# Patient Record
Sex: Male | Born: 1965 | Race: White | Hispanic: No | Marital: Single
Health system: Southern US, Community
[De-identification: ages and names within clinical notes are randomized; demographics above are authoritative.]

---

## 1999-10-26 ENCOUNTER — Inpatient Hospital Stay (HOSPITAL_COMMUNITY): Admission: EM | Admit: 1999-10-26 | Discharge: 1999-10-29 | Payer: Self-pay | Admitting: Emergency Medicine

## 1999-10-26 ENCOUNTER — Encounter: Payer: Self-pay | Admitting: Orthopedic Surgery

## 1999-10-26 ENCOUNTER — Encounter: Payer: Self-pay | Admitting: Emergency Medicine

## 2000-01-12 ENCOUNTER — Encounter: Admission: RE | Admit: 2000-01-12 | Discharge: 2000-02-10 | Payer: Self-pay | Admitting: Orthopedic Surgery

## 2003-08-04 ENCOUNTER — Encounter: Payer: Self-pay | Admitting: Neurosurgery

## 2003-08-04 ENCOUNTER — Ambulatory Visit (HOSPITAL_COMMUNITY): Admission: RE | Admit: 2003-08-04 | Discharge: 2003-08-04 | Payer: Self-pay | Admitting: Neurosurgery

## 2007-03-25 ENCOUNTER — Ambulatory Visit (HOSPITAL_COMMUNITY): Admission: RE | Admit: 2007-03-25 | Discharge: 2007-03-25 | Payer: Self-pay | Admitting: Neurosurgery

## 2008-03-12 ENCOUNTER — Encounter: Admission: RE | Admit: 2008-03-12 | Discharge: 2008-03-12 | Payer: Self-pay | Admitting: Neurosurgery

## 2008-03-15 ENCOUNTER — Inpatient Hospital Stay (HOSPITAL_COMMUNITY): Admission: RE | Admit: 2008-03-15 | Discharge: 2008-03-19 | Payer: Self-pay | Admitting: Neurosurgery

## 2010-01-30 ENCOUNTER — Emergency Department (HOSPITAL_COMMUNITY): Admission: EM | Admit: 2010-01-30 | Discharge: 2010-01-30 | Payer: Self-pay | Admitting: Emergency Medicine

## 2011-03-10 NOTE — Op Note (Signed)
NAME:  Jonathan Glenn, Jonathan Glenn              ACCOUNT NO.:  [id number]   MEDICAL RECORD NO.:  [id number]          PATIENT TYPE:  INP   LOCATION:  3010                         FACILITY:  [facility]   PHYSICIAN:  Danae Orleans. Venetia Maxon, M.D.  DATE OF BIRTH:  09-07-1966   DATE OF PROCEDURE:  DATE OF DISCHARGE:                               OPERATIVE REPORT   PREOPERATIVE DIAGNOSIS:  Pseudoarthrosis L5-S1 with spondylosis,  degenerative disease, and back pain.   POSTOPERATIVE DIAGNOSIS:  Pseudoarthrosis L5-S1 with spondylosis,  degenerative disease, and back pain.   PROCEDURE:  1. Exploration of fusion L5-S1 levels.  2. Nonsegmental pedicle screw fixation L5-S1.  3. Posterolateral arthrodesis utilizing BMP Fortoss.   SURGEON:  Danae Orleans. Venetia Maxon, MD.   ASSISTANTS:  1. Georgiann Cocker, RN.  2. Hewitt Shorts, MD.   ANESTHESIA:  General endotracheal anesthesia.   ESTIMATED BLOOD LOSS:  200 mL.   COMPLICATIONS:  None.   DISPOSITION:  Recovery.   INDICATIONS:  Jonathan Glenn is a 45 year old man who 14 years ago had  undergone L5 Gill procedure with L5-S1 fusion with a stand-alone  threaded Ray cages.  Thirteen years later after lifting a heavy table,  he developed searing low back pain which was unrelenting and has been  persistent for greater than 1 year.  The workup of this demonstrated  that he had healed the interface between the cages at L5 with cystic  changes in the bone at the S1 level.  He also had a diskogram which  demonstrated the other disk levels were not problematic in the lumbar  spine, and it was elected to take him to surgery for exploration of his  fusion and supplementation with pedicle screw fixation.   PROCEDURE:  Jonathan Glenn was brought to the operating room.  Following  satisfactory uncomplicated induction of general endotracheal anesthesia  plus intravenous lines and a Foley catheter, the patient was placed in a  prone position on the [place] table.  Low back was shaved and  prepped  and draped in the usual sterile fashion.  The area of planned incision  was infiltrated with 0.25% Marcaine and 0.5% lidocaine with 1:200,000  epinephrine.  Incision was made in the midline, carried through  approximately 6 inches of adipose tissue to the lumbodorsal fascia which  was incised bilaterally.  Subperiosteal dissection was performed  exposing the previously operated level and the L5 transverse processes  in the sacral alae bilaterally.  A 120-mm retractor blades were placed  to facilitate exposure.  The posterolateral region was then decorticated  and prepared for later fusion.  The previous fusion was inspected.  There did not appear to be solid bony growth with evidence of motion at  this level.  The scar tissue was taken down along the previous fusion  mass to confirm this.  Subsequently, the pedicle screw fixation was  placed utilizing 6.5- x 45-mm screws at L5 and 6.5- x 45-mm sacral  screws at the sacrum.  Their positioning was confirmed on AP and lateral  fluoroscopy.  A 40-mm prelordosed rods were placed overlying the screw  heads after packing the  posterolateral region.  On each side, half a  small kit of BMP was utilized on the right side Fortoss mixed with the  patient's blood and then on the left side the BMP was overlaid with 10  mL of Actifuse.  The 10 mL of Fortoss on the opposite side.  Prior to  placing the bone graft and the BMP, the wound was extensively irrigated.  The lumbodorsal fascia was then closed with 1-0 Vicryl sutures,  subcutaneous tissues were reapproximated with 2-0 Vicryl interrupted  inverted sutures, and skin edges were approximated with interrupted 3-0  Vicryl subcuticular stitch.  The wound was dressed with Benzoin, Steri-  Strips, Telfa gauze, and tape.  The patient was exposed in the operating  room, taken to recovery in stable satisfactory condition having  tolerated this operation well.  Counts were correct at the end of the   case.      Danae Orleans. Venetia Maxon, M.D.  Electronically Signed     JDS/MEDQ  D:  03/15/2008  T:  03/16/2008  Job:  [id number]

## 2011-03-10 NOTE — Discharge Summary (Signed)
NAME:  Jonathan Glenn, Jonathan Glenn              ACCOUNT NO.:  [id number]   MEDICAL RECORD NO.:  [id number]          PATIENT TYPE:  INP   LOCATION:  3010                         FACILITY:  [facility]   PHYSICIAN:  Danae Orleans. Venetia Maxon, M.D.  DATE OF BIRTH:  Jun 30, 1966   DATE OF ADMISSION:  03/15/2008  DATE OF DISCHARGE:  03/19/2008                               DISCHARGE SUMMARY   REASON FOR ADMISSION:  Pseudoarthrosis of L5-S1 with spondylosis,  degenerative disk disease, and back pain.   ADMISSION DIAGNOSES:  1. Pseudoarthrosis of L5-S1 with spondylosis, degenerative disk      disease, and back pain.  2. _________with comorbidities of diabetes, morbid obesity, and      hypertension.   HISTORY OF PRESENT ILLNESS AND HOSPITAL COURSE:  Mr. Jonathan Glenn is a  45 year old man who 14 years ago underwent L5 Gill procedure L5-S1  fusion with Rake threaded cages.  He injured himself lifting a heavy  table about a year ago and developed severe and unrelenting back pain.  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  It was  elected to take him to surgery  for exploration of fusion and reduce fusion with pedicle screw fixation  at L5-S1 levels.  He did well with surgery postoperatively.  He slowly  mobilized.  He had some belly discomfort and abdominal distention which  gradually improved.  His blood sugars were well controlled on sliding  scale insulin.  He was doing well on Mar 19, 2008, with improved pain  management having moved his bowels, was up and mobilizing on back brace  and was discharged to home in stable and satisfactory condition having  tolerated his operation and hospitalization well.   DISCHARGE MEDICATIONS:  Include Percocet and Valium.   FINAL DIAGNOSES:  1. Pseudoarthrosis of L5-S1 with spondylosis, degenerative disk      disease, and back pain.  2. Along with comorbidities of diabetes, morbid obesity, and      hypertension.   DISCHARGE STATUS:  Improved.   FOLLOWUP:  Followup in the 3 weeks in the office.      Danae Orleans. Venetia Maxon, M.D.  Electronically Signed     JDS/MEDQ  D:  03/19/2008  T:  03/19/2008  Job:  [id number]

## 2011-03-13 NOTE — Op Note (Signed)
[place]  Patient:    OLUWAFEMI VILLELLA                      MRN: [id number] Proc. Date: 10/26/99 Adm. Date:  [id number] Attending:  Ollen Gross V                           Operative Report  PREOPERATIVE DIAGNOSIS:  Comminuted trimalleolar ankle fracture dislocation.  POSTOPERATIVE DIAGNOSIS:  Comminuted trimalleolar ankle fracture dislocation.  PROCEDURE:  Open reduction and internal fixation of left ankle fracture dislocation.  SURGEON:  Ollen Gross, M.D.  ASSISTANT:  Ralene Bathe, P.A.  ANESTHESIA:  General.  ESTIMATED BLOOD LOSS:  Minimal.  DRAINS:  None.  TOURNIQUET TIME:  41 minutes at 350.  COMPLICATIONS:  None.  CONDITION:  Stable to the recovery room.  INDICATIONS:  Nikai is a 45 year old male who was helping a friend move and slipped on a cardboard box sustaining a grossly comminuted fracture dislocation of the ankle.  He presents for immediate open reduction and internal fixation.  DESCRIPTION OF PROCEDURE:  After successful administration of general anesthesia, the posterior taler dislocation is reduced and then the tourniquet is placed high on the left thigh and the left upper extremity prepped and draped in the usual sterile fashion.  The extremity is wrapped in Esmarch and the tourniquet inflated to 350.  A long incision is made over the lateral malleolus approximately 8 cm n length as he had a very long oblique fracture of the fibula.  The fracture is split in the coronal plane and then with traction anatomic reduction is performed. Three lag screws are placed from anterior to posterior to effectively anatomically reduce the fibula.  A 10-hole 1/3 tibial plate is then placed and three holes are filled distally and to the bladder malleolus and then three are placed proximal into the shaft of the fibula just above the fracture line so as to effectively bridge across the fracture.  Under fluoroscopic  guidance it was then noted that the entire ankle mortis had anatomically reduced.  An incision is then made over the medial malleolus longitudinal and the fracture edges are subperiosteally elevated and hen anatomic reduction performed.  Two 2.9 drill bits were then drilled from the tip of the fibula up and across into the tibial shaft.  50 mm cancellous screws are placed parallel to each other and this effectively anatomically reduced the medial malleolar fragment and pulled in the posterior malleolar fragment also. Multiple views are taken and it showed anatomic reduction.  At this point both wounds are copiously irrigated with antibiotic solution and tourniquet is released with total time of 41 minutes.  Subcu is closed with interrupted 2-0 Vicryl, subcuticular ith interrupted 4-0 nylon.  Incision is clean and dried and a bulky sterile dressing and posterior splint applied.  The patient is then awakend and transported to the recovery room in stable condition. DD:  10/26/99 TD:  10/27/99 Job: [id number]

## 2011-07-22 LAB — CBC
HCT: 42.4
MCHC: 33.9
MCV: 87.3
Platelets: 261
RDW: 13.1

## 2011-07-22 LAB — BASIC METABOLIC PANEL
BUN: 12
CO2: 26
GFR calc non Af Amer: >60
Glucose, Bld: 132 — ABNORMAL HIGH
Potassium: 3.8

## 2011-07-22 LAB — TYPE AND SCREEN

## 2011-12-18 IMAGING — CR DG WRIST COMPLETE 3+V*L*
4 series · 4 of 4 positions shown · non-contrast
Comparison: None

CLINICAL DATA: Left wrist pain following injury and fall.

LEFT WRIST - COMPLETE 3+ VIEW

[x wrist pa left]
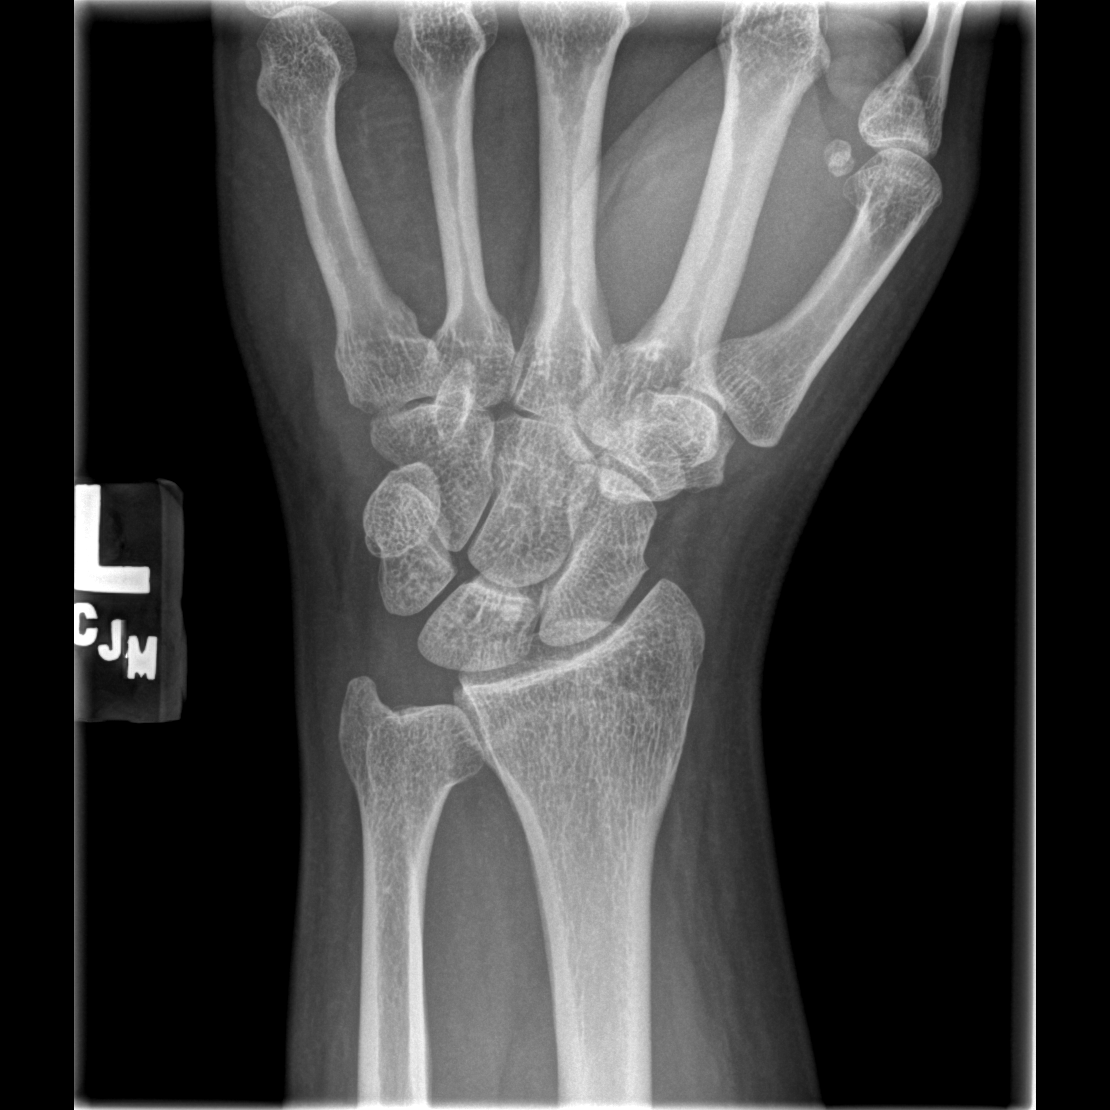

[x wrist obl left]
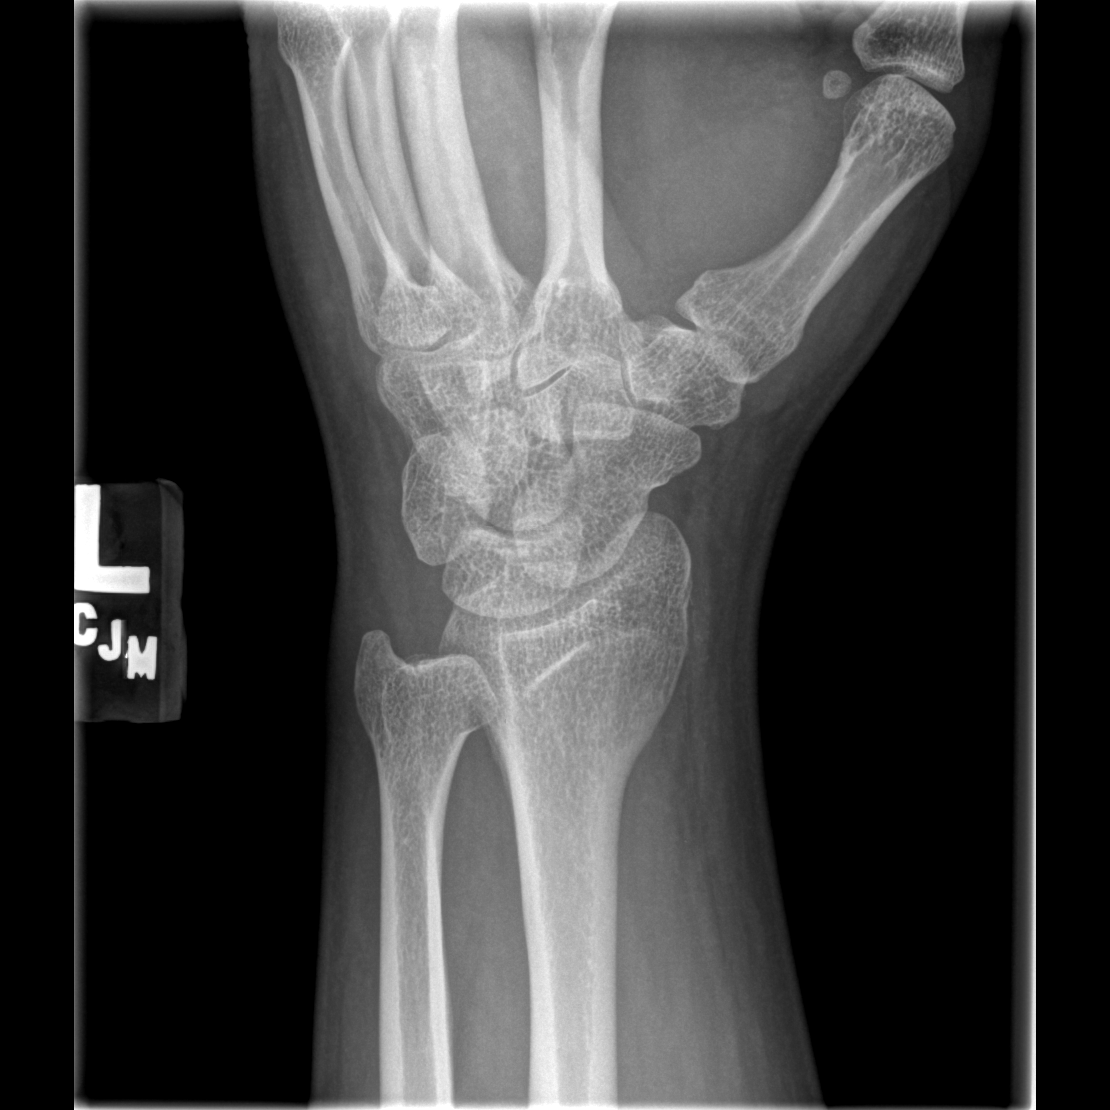

[x wrist lat left]
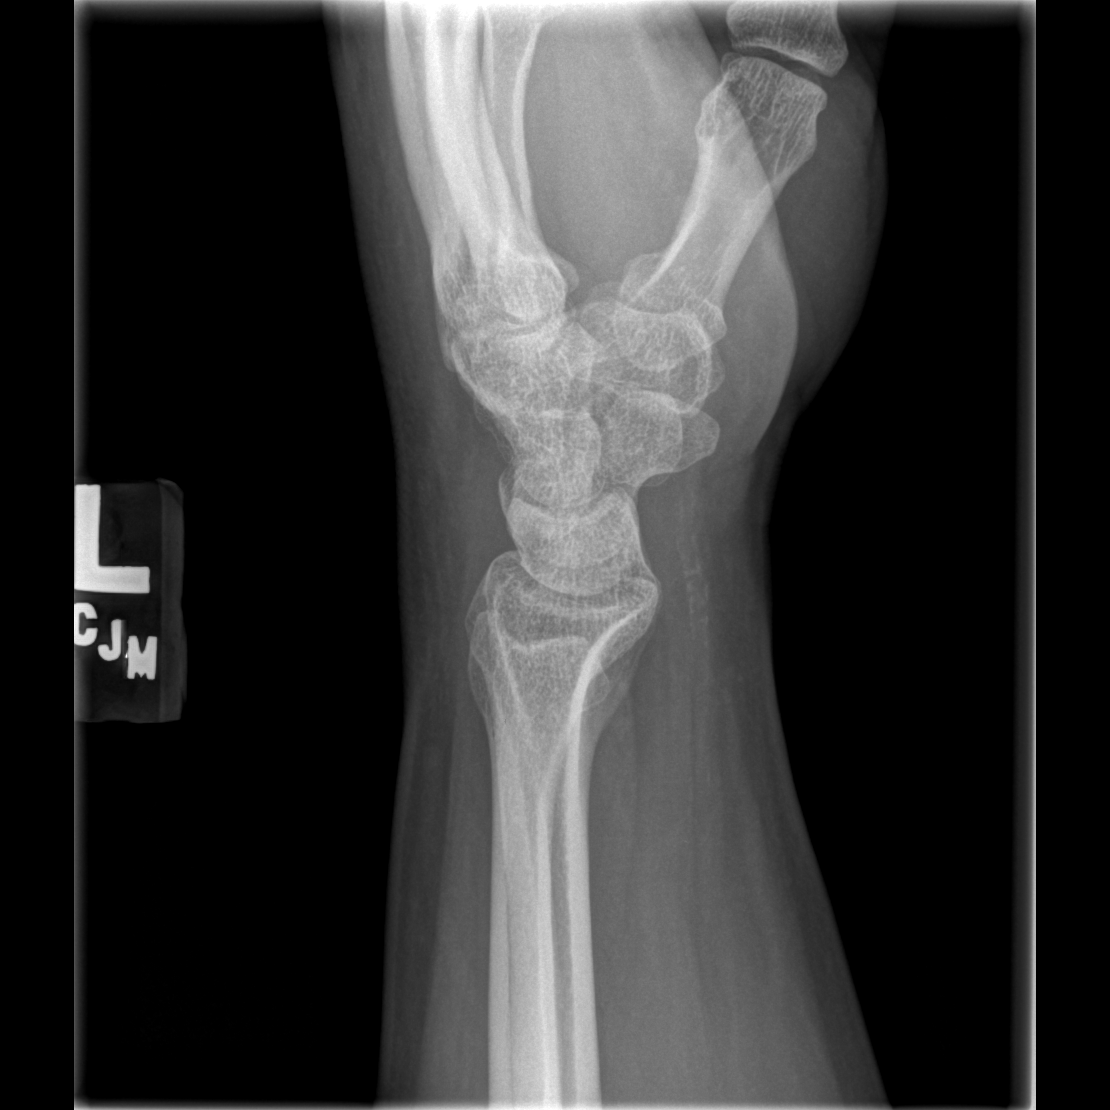

[x navicular]
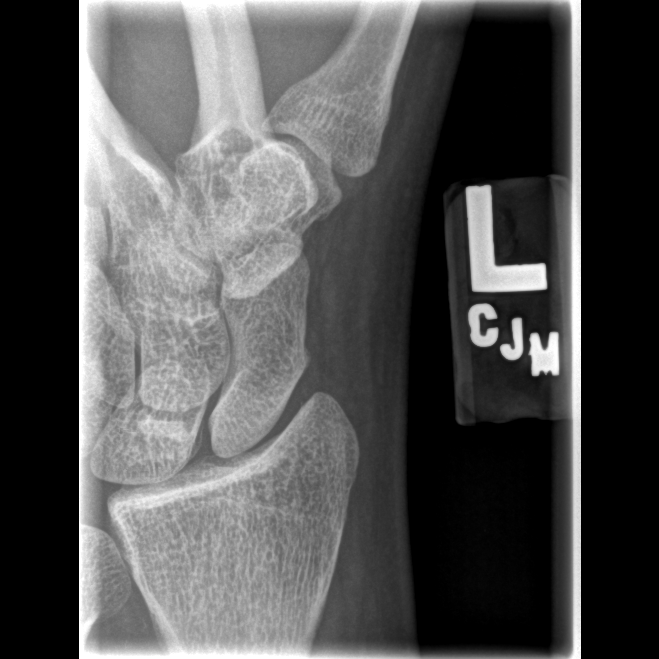

[4 of 4 positions shown; findings below may reference images not displayed]

FINDINGS: No evidence of acute fracture, subluxation or dislocation
identified.

No radio-opaque foreign bodies are present.

No focal bony lesions are noted.

The joint spaces are unremarkable.

Negative ulnar variance is noted.
Vascular calcifications are identified.
IMPRESSION: No evidence of acute bony abnormality.

## 2013-09-26 DIAGNOSIS — G609 Hereditary and idiopathic neuropathy, unspecified: Secondary | ICD-10-CM | POA: Insufficient documentation

## 2016-01-02 DIAGNOSIS — K645 Perianal venous thrombosis: Secondary | ICD-10-CM | POA: Diagnosis not present

## 2016-02-04 DIAGNOSIS — M545 Low back pain: Secondary | ICD-10-CM | POA: Diagnosis not present

## 2016-02-04 DIAGNOSIS — M5416 Radiculopathy, lumbar region: Secondary | ICD-10-CM | POA: Diagnosis not present

## 2016-02-10 DIAGNOSIS — Z5181 Encounter for therapeutic drug level monitoring: Secondary | ICD-10-CM | POA: Diagnosis not present

## 2016-02-10 DIAGNOSIS — E11319 Type 2 diabetes mellitus with unspecified diabetic retinopathy without macular edema: Secondary | ICD-10-CM | POA: Diagnosis not present

## 2016-02-10 DIAGNOSIS — Z794 Long term (current) use of insulin: Secondary | ICD-10-CM | POA: Diagnosis not present

## 2016-02-10 DIAGNOSIS — E1142 Type 2 diabetes mellitus with diabetic polyneuropathy: Secondary | ICD-10-CM | POA: Diagnosis not present

## 2016-05-04 DIAGNOSIS — M5416 Radiculopathy, lumbar region: Secondary | ICD-10-CM | POA: Diagnosis not present

## 2016-05-04 DIAGNOSIS — M545 Low back pain: Secondary | ICD-10-CM | POA: Diagnosis not present

## 2016-06-01 DIAGNOSIS — Z5181 Encounter for therapeutic drug level monitoring: Secondary | ICD-10-CM | POA: Diagnosis not present

## 2016-06-01 DIAGNOSIS — Z794 Long term (current) use of insulin: Secondary | ICD-10-CM | POA: Diagnosis not present

## 2016-06-01 DIAGNOSIS — Z7984 Long term (current) use of oral hypoglycemic drugs: Secondary | ICD-10-CM | POA: Diagnosis not present

## 2016-06-01 DIAGNOSIS — E1142 Type 2 diabetes mellitus with diabetic polyneuropathy: Secondary | ICD-10-CM | POA: Diagnosis not present

## 2016-06-03 DIAGNOSIS — E1142 Type 2 diabetes mellitus with diabetic polyneuropathy: Secondary | ICD-10-CM | POA: Diagnosis not present

## 2016-06-03 DIAGNOSIS — Z794 Long term (current) use of insulin: Secondary | ICD-10-CM | POA: Diagnosis not present

## 2016-06-03 DIAGNOSIS — E11319 Type 2 diabetes mellitus with unspecified diabetic retinopathy without macular edema: Secondary | ICD-10-CM | POA: Diagnosis not present

## 2016-07-29 DIAGNOSIS — Z6834 Body mass index (BMI) 34.0-34.9, adult: Secondary | ICD-10-CM | POA: Diagnosis not present

## 2016-07-29 DIAGNOSIS — M545 Low back pain: Secondary | ICD-10-CM | POA: Diagnosis not present

## 2016-07-29 DIAGNOSIS — M5416 Radiculopathy, lumbar region: Secondary | ICD-10-CM | POA: Diagnosis not present

## 2016-10-14 DIAGNOSIS — M5416 Radiculopathy, lumbar region: Secondary | ICD-10-CM | POA: Diagnosis not present

## 2016-10-14 DIAGNOSIS — M545 Low back pain: Secondary | ICD-10-CM | POA: Diagnosis not present

## 2016-12-09 DIAGNOSIS — E11319 Type 2 diabetes mellitus with unspecified diabetic retinopathy without macular edema: Secondary | ICD-10-CM | POA: Diagnosis not present

## 2016-12-09 DIAGNOSIS — Z794 Long term (current) use of insulin: Secondary | ICD-10-CM | POA: Diagnosis not present

## 2016-12-09 DIAGNOSIS — E1142 Type 2 diabetes mellitus with diabetic polyneuropathy: Secondary | ICD-10-CM | POA: Diagnosis not present

## 2017-01-05 DIAGNOSIS — I10 Essential (primary) hypertension: Secondary | ICD-10-CM | POA: Diagnosis not present

## 2017-01-05 DIAGNOSIS — M545 Low back pain: Secondary | ICD-10-CM | POA: Diagnosis not present

## 2017-01-05 DIAGNOSIS — Z6833 Body mass index (BMI) 33.0-33.9, adult: Secondary | ICD-10-CM | POA: Diagnosis not present

## 2017-01-05 DIAGNOSIS — M5416 Radiculopathy, lumbar region: Secondary | ICD-10-CM | POA: Diagnosis not present

## 2017-03-31 DIAGNOSIS — I10 Essential (primary) hypertension: Secondary | ICD-10-CM | POA: Diagnosis not present

## 2017-03-31 DIAGNOSIS — Z6834 Body mass index (BMI) 34.0-34.9, adult: Secondary | ICD-10-CM | POA: Diagnosis not present

## 2017-03-31 DIAGNOSIS — M545 Low back pain: Secondary | ICD-10-CM | POA: Diagnosis not present

## 2017-04-13 DIAGNOSIS — E11311 Type 2 diabetes mellitus with unspecified diabetic retinopathy with macular edema: Secondary | ICD-10-CM | POA: Diagnosis not present

## 2017-04-26 DIAGNOSIS — M545 Low back pain: Secondary | ICD-10-CM | POA: Diagnosis not present

## 2017-05-17 DIAGNOSIS — M5416 Radiculopathy, lumbar region: Secondary | ICD-10-CM | POA: Diagnosis not present

## 2017-05-17 DIAGNOSIS — M545 Low back pain: Secondary | ICD-10-CM | POA: Diagnosis not present

## 2017-05-17 DIAGNOSIS — Z6833 Body mass index (BMI) 33.0-33.9, adult: Secondary | ICD-10-CM | POA: Diagnosis not present

## 2017-05-17 DIAGNOSIS — I10 Essential (primary) hypertension: Secondary | ICD-10-CM | POA: Diagnosis not present

## 2017-06-10 DIAGNOSIS — E1142 Type 2 diabetes mellitus with diabetic polyneuropathy: Secondary | ICD-10-CM | POA: Diagnosis not present

## 2017-06-10 DIAGNOSIS — Z79899 Other long term (current) drug therapy: Secondary | ICD-10-CM | POA: Diagnosis not present

## 2017-06-10 DIAGNOSIS — Z5181 Encounter for therapeutic drug level monitoring: Secondary | ICD-10-CM | POA: Diagnosis not present

## 2017-06-10 DIAGNOSIS — Z794 Long term (current) use of insulin: Secondary | ICD-10-CM | POA: Diagnosis not present

## 2017-06-10 DIAGNOSIS — E11319 Type 2 diabetes mellitus with unspecified diabetic retinopathy without macular edema: Secondary | ICD-10-CM | POA: Diagnosis not present

## 2017-06-18 DIAGNOSIS — E113392 Type 2 diabetes mellitus with moderate nonproliferative diabetic retinopathy without macular edema, left eye: Secondary | ICD-10-CM | POA: Diagnosis not present

## 2017-06-18 DIAGNOSIS — E113311 Type 2 diabetes mellitus with moderate nonproliferative diabetic retinopathy with macular edema, right eye: Secondary | ICD-10-CM | POA: Diagnosis not present

## 2017-07-14 DIAGNOSIS — E113311 Type 2 diabetes mellitus with moderate nonproliferative diabetic retinopathy with macular edema, right eye: Secondary | ICD-10-CM | POA: Diagnosis not present

## 2017-07-14 DIAGNOSIS — E113392 Type 2 diabetes mellitus with moderate nonproliferative diabetic retinopathy without macular edema, left eye: Secondary | ICD-10-CM | POA: Diagnosis not present

## 2017-08-12 DIAGNOSIS — Z6834 Body mass index (BMI) 34.0-34.9, adult: Secondary | ICD-10-CM | POA: Diagnosis not present

## 2017-08-12 DIAGNOSIS — M5416 Radiculopathy, lumbar region: Secondary | ICD-10-CM | POA: Diagnosis not present

## 2017-08-12 DIAGNOSIS — M545 Low back pain: Secondary | ICD-10-CM | POA: Diagnosis not present

## 2017-08-12 DIAGNOSIS — I10 Essential (primary) hypertension: Secondary | ICD-10-CM | POA: Diagnosis not present

## 2017-11-10 DIAGNOSIS — M5416 Radiculopathy, lumbar region: Secondary | ICD-10-CM | POA: Diagnosis not present

## 2017-11-10 DIAGNOSIS — M545 Low back pain: Secondary | ICD-10-CM | POA: Diagnosis not present

## 2017-12-16 DIAGNOSIS — E11319 Type 2 diabetes mellitus with unspecified diabetic retinopathy without macular edema: Secondary | ICD-10-CM | POA: Diagnosis not present

## 2017-12-16 DIAGNOSIS — Z794 Long term (current) use of insulin: Secondary | ICD-10-CM | POA: Diagnosis not present

## 2017-12-16 DIAGNOSIS — E1142 Type 2 diabetes mellitus with diabetic polyneuropathy: Secondary | ICD-10-CM | POA: Diagnosis not present

## 2017-12-16 DIAGNOSIS — Z5181 Encounter for therapeutic drug level monitoring: Secondary | ICD-10-CM | POA: Diagnosis not present

## 2017-12-29 DIAGNOSIS — H31092 Other chorioretinal scars, left eye: Secondary | ICD-10-CM | POA: Diagnosis not present

## 2017-12-29 DIAGNOSIS — E113393 Type 2 diabetes mellitus with moderate nonproliferative diabetic retinopathy without macular edema, bilateral: Secondary | ICD-10-CM | POA: Diagnosis not present

## 2017-12-29 DIAGNOSIS — H2513 Age-related nuclear cataract, bilateral: Secondary | ICD-10-CM | POA: Diagnosis not present

## 2018-02-02 DIAGNOSIS — M5416 Radiculopathy, lumbar region: Secondary | ICD-10-CM | POA: Diagnosis not present

## 2018-02-02 DIAGNOSIS — I10 Essential (primary) hypertension: Secondary | ICD-10-CM | POA: Diagnosis not present

## 2018-02-02 DIAGNOSIS — Z6833 Body mass index (BMI) 33.0-33.9, adult: Secondary | ICD-10-CM | POA: Diagnosis not present

## 2018-02-02 DIAGNOSIS — M545 Low back pain: Secondary | ICD-10-CM | POA: Diagnosis not present

## 2018-05-04 DIAGNOSIS — M545 Low back pain: Secondary | ICD-10-CM | POA: Diagnosis not present

## 2018-05-04 DIAGNOSIS — M5416 Radiculopathy, lumbar region: Secondary | ICD-10-CM | POA: Diagnosis not present

## 2018-05-04 DIAGNOSIS — Z6833 Body mass index (BMI) 33.0-33.9, adult: Secondary | ICD-10-CM | POA: Diagnosis not present

## 2018-05-04 DIAGNOSIS — I10 Essential (primary) hypertension: Secondary | ICD-10-CM | POA: Diagnosis not present

## 2018-06-20 DIAGNOSIS — E11319 Type 2 diabetes mellitus with unspecified diabetic retinopathy without macular edema: Secondary | ICD-10-CM | POA: Diagnosis not present

## 2018-06-20 DIAGNOSIS — Z79899 Other long term (current) drug therapy: Secondary | ICD-10-CM | POA: Diagnosis not present

## 2018-06-20 DIAGNOSIS — E1142 Type 2 diabetes mellitus with diabetic polyneuropathy: Secondary | ICD-10-CM | POA: Diagnosis not present

## 2018-06-20 DIAGNOSIS — Z794 Long term (current) use of insulin: Secondary | ICD-10-CM | POA: Diagnosis not present

## 2018-07-25 DIAGNOSIS — M5416 Radiculopathy, lumbar region: Secondary | ICD-10-CM | POA: Diagnosis not present

## 2018-07-25 DIAGNOSIS — M545 Low back pain: Secondary | ICD-10-CM | POA: Diagnosis not present

## 2018-07-25 DIAGNOSIS — I10 Essential (primary) hypertension: Secondary | ICD-10-CM | POA: Diagnosis not present

## 2018-07-25 DIAGNOSIS — Z6832 Body mass index (BMI) 32.0-32.9, adult: Secondary | ICD-10-CM | POA: Diagnosis not present

## 2018-10-13 DIAGNOSIS — M545 Low back pain: Secondary | ICD-10-CM | POA: Diagnosis not present

## 2018-10-13 DIAGNOSIS — Z6833 Body mass index (BMI) 33.0-33.9, adult: Secondary | ICD-10-CM | POA: Diagnosis not present

## 2018-10-13 DIAGNOSIS — M5416 Radiculopathy, lumbar region: Secondary | ICD-10-CM | POA: Diagnosis not present

## 2018-10-13 DIAGNOSIS — I10 Essential (primary) hypertension: Secondary | ICD-10-CM | POA: Diagnosis not present

## 2018-12-21 DIAGNOSIS — Z5181 Encounter for therapeutic drug level monitoring: Secondary | ICD-10-CM | POA: Diagnosis not present

## 2018-12-21 DIAGNOSIS — E1142 Type 2 diabetes mellitus with diabetic polyneuropathy: Secondary | ICD-10-CM | POA: Diagnosis not present

## 2018-12-21 DIAGNOSIS — Z794 Long term (current) use of insulin: Secondary | ICD-10-CM | POA: Diagnosis not present

## 2018-12-21 DIAGNOSIS — E11319 Type 2 diabetes mellitus with unspecified diabetic retinopathy without macular edema: Secondary | ICD-10-CM | POA: Diagnosis not present

## 2018-12-28 DIAGNOSIS — I10 Essential (primary) hypertension: Secondary | ICD-10-CM | POA: Diagnosis not present

## 2018-12-28 DIAGNOSIS — M5416 Radiculopathy, lumbar region: Secondary | ICD-10-CM | POA: Diagnosis not present

## 2018-12-28 DIAGNOSIS — M545 Low back pain: Secondary | ICD-10-CM | POA: Diagnosis not present

## 2018-12-28 DIAGNOSIS — Z6833 Body mass index (BMI) 33.0-33.9, adult: Secondary | ICD-10-CM | POA: Diagnosis not present

## 2019-03-15 DIAGNOSIS — Z79899 Other long term (current) drug therapy: Secondary | ICD-10-CM | POA: Diagnosis not present

## 2019-03-15 DIAGNOSIS — E11319 Type 2 diabetes mellitus with unspecified diabetic retinopathy without macular edema: Secondary | ICD-10-CM | POA: Diagnosis not present

## 2019-03-15 DIAGNOSIS — Z794 Long term (current) use of insulin: Secondary | ICD-10-CM | POA: Diagnosis not present

## 2019-03-15 DIAGNOSIS — E1142 Type 2 diabetes mellitus with diabetic polyneuropathy: Secondary | ICD-10-CM | POA: Diagnosis not present

## 2019-04-03 DIAGNOSIS — M5416 Radiculopathy, lumbar region: Secondary | ICD-10-CM | POA: Diagnosis not present

## 2019-04-03 DIAGNOSIS — M5136 Other intervertebral disc degeneration, lumbar region: Secondary | ICD-10-CM | POA: Diagnosis not present

## 2019-06-07 DIAGNOSIS — Z79899 Other long term (current) drug therapy: Secondary | ICD-10-CM | POA: Diagnosis not present

## 2019-06-07 DIAGNOSIS — E1142 Type 2 diabetes mellitus with diabetic polyneuropathy: Secondary | ICD-10-CM | POA: Diagnosis not present

## 2019-06-07 DIAGNOSIS — E1165 Type 2 diabetes mellitus with hyperglycemia: Secondary | ICD-10-CM | POA: Diagnosis not present

## 2019-06-16 DIAGNOSIS — Z794 Long term (current) use of insulin: Secondary | ICD-10-CM | POA: Diagnosis not present

## 2019-06-16 DIAGNOSIS — E11319 Type 2 diabetes mellitus with unspecified diabetic retinopathy without macular edema: Secondary | ICD-10-CM | POA: Diagnosis not present

## 2019-06-16 DIAGNOSIS — E1142 Type 2 diabetes mellitus with diabetic polyneuropathy: Secondary | ICD-10-CM | POA: Diagnosis not present

## 2019-06-16 DIAGNOSIS — Z5181 Encounter for therapeutic drug level monitoring: Secondary | ICD-10-CM | POA: Diagnosis not present

## 2019-07-05 DIAGNOSIS — M961 Postlaminectomy syndrome, not elsewhere classified: Secondary | ICD-10-CM | POA: Diagnosis not present

## 2019-07-05 DIAGNOSIS — M5416 Radiculopathy, lumbar region: Secondary | ICD-10-CM | POA: Diagnosis not present

## 2019-07-05 DIAGNOSIS — I10 Essential (primary) hypertension: Secondary | ICD-10-CM | POA: Diagnosis not present

## 2019-07-05 DIAGNOSIS — M5136 Other intervertebral disc degeneration, lumbar region: Secondary | ICD-10-CM | POA: Diagnosis not present

## 2019-08-31 DIAGNOSIS — E113293 Type 2 diabetes mellitus with mild nonproliferative diabetic retinopathy without macular edema, bilateral: Secondary | ICD-10-CM | POA: Diagnosis not present

## 2019-09-14 DIAGNOSIS — H524 Presbyopia: Secondary | ICD-10-CM | POA: Diagnosis not present

## 2019-09-28 DIAGNOSIS — I10 Essential (primary) hypertension: Secondary | ICD-10-CM | POA: Diagnosis not present

## 2019-09-28 DIAGNOSIS — Z6833 Body mass index (BMI) 33.0-33.9, adult: Secondary | ICD-10-CM | POA: Diagnosis not present

## 2019-09-29 DIAGNOSIS — E1142 Type 2 diabetes mellitus with diabetic polyneuropathy: Secondary | ICD-10-CM | POA: Diagnosis not present

## 2019-09-29 DIAGNOSIS — E11319 Type 2 diabetes mellitus with unspecified diabetic retinopathy without macular edema: Secondary | ICD-10-CM | POA: Diagnosis not present

## 2019-09-29 DIAGNOSIS — Z5181 Encounter for therapeutic drug level monitoring: Secondary | ICD-10-CM | POA: Diagnosis not present

## 2019-09-29 DIAGNOSIS — Z794 Long term (current) use of insulin: Secondary | ICD-10-CM | POA: Diagnosis not present

## 2019-10-18 DIAGNOSIS — E1165 Type 2 diabetes mellitus with hyperglycemia: Secondary | ICD-10-CM | POA: Diagnosis not present

## 2019-10-18 DIAGNOSIS — E1142 Type 2 diabetes mellitus with diabetic polyneuropathy: Secondary | ICD-10-CM | POA: Diagnosis not present

## 2019-12-28 DIAGNOSIS — Z5181 Encounter for therapeutic drug level monitoring: Secondary | ICD-10-CM | POA: Diagnosis not present

## 2019-12-28 DIAGNOSIS — I10 Essential (primary) hypertension: Secondary | ICD-10-CM | POA: Diagnosis not present

## 2019-12-28 DIAGNOSIS — M5416 Radiculopathy, lumbar region: Secondary | ICD-10-CM | POA: Diagnosis not present

## 2019-12-28 DIAGNOSIS — E11319 Type 2 diabetes mellitus with unspecified diabetic retinopathy without macular edema: Secondary | ICD-10-CM | POA: Diagnosis not present

## 2019-12-28 DIAGNOSIS — E1142 Type 2 diabetes mellitus with diabetic polyneuropathy: Secondary | ICD-10-CM | POA: Diagnosis not present

## 2019-12-28 DIAGNOSIS — M961 Postlaminectomy syndrome, not elsewhere classified: Secondary | ICD-10-CM | POA: Diagnosis not present

## 2019-12-28 DIAGNOSIS — Z6833 Body mass index (BMI) 33.0-33.9, adult: Secondary | ICD-10-CM | POA: Diagnosis not present

## 2019-12-28 DIAGNOSIS — Z794 Long term (current) use of insulin: Secondary | ICD-10-CM | POA: Diagnosis not present

## 2020-01-19 DIAGNOSIS — E1142 Type 2 diabetes mellitus with diabetic polyneuropathy: Secondary | ICD-10-CM | POA: Diagnosis not present

## 2020-03-28 DIAGNOSIS — M5416 Radiculopathy, lumbar region: Secondary | ICD-10-CM | POA: Diagnosis not present

## 2020-03-28 DIAGNOSIS — M961 Postlaminectomy syndrome, not elsewhere classified: Secondary | ICD-10-CM | POA: Diagnosis not present

## 2020-05-01 DIAGNOSIS — Z7984 Long term (current) use of oral hypoglycemic drugs: Secondary | ICD-10-CM | POA: Diagnosis not present

## 2020-05-01 DIAGNOSIS — E11319 Type 2 diabetes mellitus with unspecified diabetic retinopathy without macular edema: Secondary | ICD-10-CM | POA: Diagnosis not present

## 2020-05-01 DIAGNOSIS — E1142 Type 2 diabetes mellitus with diabetic polyneuropathy: Secondary | ICD-10-CM | POA: Diagnosis not present

## 2020-05-01 DIAGNOSIS — Z794 Long term (current) use of insulin: Secondary | ICD-10-CM | POA: Diagnosis not present

## 2020-06-25 DIAGNOSIS — Z6825 Body mass index (BMI) 25.0-25.9, adult: Secondary | ICD-10-CM | POA: Diagnosis not present

## 2020-06-25 DIAGNOSIS — I10 Essential (primary) hypertension: Secondary | ICD-10-CM | POA: Diagnosis not present

## 2020-06-25 DIAGNOSIS — M5416 Radiculopathy, lumbar region: Secondary | ICD-10-CM | POA: Diagnosis not present

## 2020-06-25 DIAGNOSIS — M961 Postlaminectomy syndrome, not elsewhere classified: Secondary | ICD-10-CM | POA: Diagnosis not present

## 2020-09-24 DIAGNOSIS — M5416 Radiculopathy, lumbar region: Secondary | ICD-10-CM | POA: Diagnosis not present

## 2020-09-24 DIAGNOSIS — M961 Postlaminectomy syndrome, not elsewhere classified: Secondary | ICD-10-CM | POA: Diagnosis not present

## 2020-11-01 DIAGNOSIS — E1142 Type 2 diabetes mellitus with diabetic polyneuropathy: Secondary | ICD-10-CM | POA: Diagnosis not present

## 2020-11-01 DIAGNOSIS — Z794 Long term (current) use of insulin: Secondary | ICD-10-CM | POA: Diagnosis not present

## 2020-11-01 DIAGNOSIS — E11319 Type 2 diabetes mellitus with unspecified diabetic retinopathy without macular edema: Secondary | ICD-10-CM | POA: Diagnosis not present

## 2020-11-01 DIAGNOSIS — Z7984 Long term (current) use of oral hypoglycemic drugs: Secondary | ICD-10-CM | POA: Diagnosis not present

## 2020-12-13 DIAGNOSIS — E11319 Type 2 diabetes mellitus with unspecified diabetic retinopathy without macular edema: Secondary | ICD-10-CM | POA: Diagnosis not present

## 2020-12-16 DIAGNOSIS — M5416 Radiculopathy, lumbar region: Secondary | ICD-10-CM | POA: Diagnosis not present

## 2020-12-16 DIAGNOSIS — M961 Postlaminectomy syndrome, not elsewhere classified: Secondary | ICD-10-CM | POA: Diagnosis not present

## 2021-01-07 DIAGNOSIS — H524 Presbyopia: Secondary | ICD-10-CM | POA: Diagnosis not present

## 2021-02-18 DIAGNOSIS — M961 Postlaminectomy syndrome, not elsewhere classified: Secondary | ICD-10-CM | POA: Diagnosis not present

## 2021-02-18 DIAGNOSIS — M5416 Radiculopathy, lumbar region: Secondary | ICD-10-CM | POA: Diagnosis not present

## 2021-05-01 DIAGNOSIS — E11319 Type 2 diabetes mellitus with unspecified diabetic retinopathy without macular edema: Secondary | ICD-10-CM | POA: Diagnosis not present

## 2021-05-01 DIAGNOSIS — Z7984 Long term (current) use of oral hypoglycemic drugs: Secondary | ICD-10-CM | POA: Diagnosis not present

## 2021-05-01 DIAGNOSIS — E1142 Type 2 diabetes mellitus with diabetic polyneuropathy: Secondary | ICD-10-CM | POA: Diagnosis not present

## 2021-05-01 DIAGNOSIS — E1165 Type 2 diabetes mellitus with hyperglycemia: Secondary | ICD-10-CM | POA: Diagnosis not present

## 2021-05-01 DIAGNOSIS — Z794 Long term (current) use of insulin: Secondary | ICD-10-CM | POA: Diagnosis not present

## 2021-05-13 DIAGNOSIS — M5416 Radiculopathy, lumbar region: Secondary | ICD-10-CM | POA: Diagnosis not present

## 2021-05-13 DIAGNOSIS — M961 Postlaminectomy syndrome, not elsewhere classified: Secondary | ICD-10-CM | POA: Diagnosis not present

## 2021-06-02 DIAGNOSIS — U071 COVID-19: Secondary | ICD-10-CM | POA: Diagnosis not present

## 2021-08-01 DIAGNOSIS — E1142 Type 2 diabetes mellitus with diabetic polyneuropathy: Secondary | ICD-10-CM | POA: Diagnosis not present

## 2021-08-01 DIAGNOSIS — E11319 Type 2 diabetes mellitus with unspecified diabetic retinopathy without macular edema: Secondary | ICD-10-CM | POA: Diagnosis not present

## 2021-08-01 DIAGNOSIS — E1165 Type 2 diabetes mellitus with hyperglycemia: Secondary | ICD-10-CM | POA: Diagnosis not present

## 2021-08-01 DIAGNOSIS — Z794 Long term (current) use of insulin: Secondary | ICD-10-CM | POA: Diagnosis not present

## 2021-08-12 DIAGNOSIS — I10 Essential (primary) hypertension: Secondary | ICD-10-CM | POA: Diagnosis not present

## 2021-08-12 DIAGNOSIS — M961 Postlaminectomy syndrome, not elsewhere classified: Secondary | ICD-10-CM | POA: Diagnosis not present

## 2021-08-12 DIAGNOSIS — Z6832 Body mass index (BMI) 32.0-32.9, adult: Secondary | ICD-10-CM | POA: Diagnosis not present

## 2021-08-12 DIAGNOSIS — M5416 Radiculopathy, lumbar region: Secondary | ICD-10-CM | POA: Diagnosis not present

## 2021-11-10 DIAGNOSIS — F1120 Opioid dependence, uncomplicated: Secondary | ICD-10-CM | POA: Diagnosis not present

## 2021-11-10 DIAGNOSIS — M5416 Radiculopathy, lumbar region: Secondary | ICD-10-CM | POA: Diagnosis not present

## 2021-11-10 DIAGNOSIS — M961 Postlaminectomy syndrome, not elsewhere classified: Secondary | ICD-10-CM | POA: Diagnosis not present

## 2021-11-14 DIAGNOSIS — M7501 Adhesive capsulitis of right shoulder: Secondary | ICD-10-CM | POA: Diagnosis not present

## 2021-11-17 DIAGNOSIS — M7501 Adhesive capsulitis of right shoulder: Secondary | ICD-10-CM | POA: Diagnosis not present

## 2021-11-28 DIAGNOSIS — M25511 Pain in right shoulder: Secondary | ICD-10-CM | POA: Diagnosis not present

## 2021-12-02 DIAGNOSIS — Z794 Long term (current) use of insulin: Secondary | ICD-10-CM | POA: Diagnosis not present

## 2021-12-02 DIAGNOSIS — E1142 Type 2 diabetes mellitus with diabetic polyneuropathy: Secondary | ICD-10-CM | POA: Diagnosis not present

## 2021-12-02 DIAGNOSIS — M25511 Pain in right shoulder: Secondary | ICD-10-CM | POA: Diagnosis not present

## 2021-12-02 DIAGNOSIS — E1165 Type 2 diabetes mellitus with hyperglycemia: Secondary | ICD-10-CM | POA: Diagnosis not present

## 2021-12-02 DIAGNOSIS — E11319 Type 2 diabetes mellitus with unspecified diabetic retinopathy without macular edema: Secondary | ICD-10-CM | POA: Diagnosis not present

## 2021-12-02 DIAGNOSIS — Z7984 Long term (current) use of oral hypoglycemic drugs: Secondary | ICD-10-CM | POA: Diagnosis not present

## 2021-12-04 DIAGNOSIS — M25511 Pain in right shoulder: Secondary | ICD-10-CM | POA: Diagnosis not present

## 2021-12-09 DIAGNOSIS — M25511 Pain in right shoulder: Secondary | ICD-10-CM | POA: Diagnosis not present

## 2021-12-11 DIAGNOSIS — M25511 Pain in right shoulder: Secondary | ICD-10-CM | POA: Diagnosis not present

## 2021-12-16 DIAGNOSIS — M25511 Pain in right shoulder: Secondary | ICD-10-CM | POA: Diagnosis not present

## 2021-12-18 DIAGNOSIS — M25511 Pain in right shoulder: Secondary | ICD-10-CM | POA: Diagnosis not present

## 2021-12-23 DIAGNOSIS — M25511 Pain in right shoulder: Secondary | ICD-10-CM | POA: Diagnosis not present

## 2021-12-25 DIAGNOSIS — M25511 Pain in right shoulder: Secondary | ICD-10-CM | POA: Diagnosis not present

## 2022-01-19 DIAGNOSIS — F1120 Opioid dependence, uncomplicated: Secondary | ICD-10-CM | POA: Diagnosis not present

## 2022-01-19 DIAGNOSIS — M5416 Radiculopathy, lumbar region: Secondary | ICD-10-CM | POA: Diagnosis not present

## 2022-01-19 DIAGNOSIS — Z6831 Body mass index (BMI) 31.0-31.9, adult: Secondary | ICD-10-CM | POA: Diagnosis not present

## 2022-01-19 DIAGNOSIS — M961 Postlaminectomy syndrome, not elsewhere classified: Secondary | ICD-10-CM | POA: Diagnosis not present

## 2022-03-03 DIAGNOSIS — E1165 Type 2 diabetes mellitus with hyperglycemia: Secondary | ICD-10-CM | POA: Diagnosis not present

## 2022-03-03 DIAGNOSIS — Z794 Long term (current) use of insulin: Secondary | ICD-10-CM | POA: Diagnosis not present

## 2022-03-03 DIAGNOSIS — E11319 Type 2 diabetes mellitus with unspecified diabetic retinopathy without macular edema: Secondary | ICD-10-CM | POA: Diagnosis not present

## 2022-03-03 DIAGNOSIS — E1142 Type 2 diabetes mellitus with diabetic polyneuropathy: Secondary | ICD-10-CM | POA: Diagnosis not present

## 2022-03-31 DIAGNOSIS — F1120 Opioid dependence, uncomplicated: Secondary | ICD-10-CM | POA: Diagnosis not present

## 2022-03-31 DIAGNOSIS — M961 Postlaminectomy syndrome, not elsewhere classified: Secondary | ICD-10-CM | POA: Diagnosis not present

## 2022-03-31 DIAGNOSIS — M5416 Radiculopathy, lumbar region: Secondary | ICD-10-CM | POA: Diagnosis not present

## 2022-06-09 DIAGNOSIS — M961 Postlaminectomy syndrome, not elsewhere classified: Secondary | ICD-10-CM | POA: Diagnosis not present

## 2022-06-09 DIAGNOSIS — F1120 Opioid dependence, uncomplicated: Secondary | ICD-10-CM | POA: Diagnosis not present

## 2022-06-09 DIAGNOSIS — M5416 Radiculopathy, lumbar region: Secondary | ICD-10-CM | POA: Diagnosis not present

## 2022-06-25 DIAGNOSIS — Z794 Long term (current) use of insulin: Secondary | ICD-10-CM | POA: Diagnosis not present

## 2022-06-25 DIAGNOSIS — E11319 Type 2 diabetes mellitus with unspecified diabetic retinopathy without macular edema: Secondary | ICD-10-CM | POA: Diagnosis not present

## 2022-06-25 DIAGNOSIS — E1142 Type 2 diabetes mellitus with diabetic polyneuropathy: Secondary | ICD-10-CM | POA: Diagnosis not present

## 2022-06-25 DIAGNOSIS — E1165 Type 2 diabetes mellitus with hyperglycemia: Secondary | ICD-10-CM | POA: Diagnosis not present

## 2022-08-31 DIAGNOSIS — F1120 Opioid dependence, uncomplicated: Secondary | ICD-10-CM | POA: Diagnosis not present

## 2022-08-31 DIAGNOSIS — M5416 Radiculopathy, lumbar region: Secondary | ICD-10-CM | POA: Diagnosis not present

## 2022-08-31 DIAGNOSIS — Z6831 Body mass index (BMI) 31.0-31.9, adult: Secondary | ICD-10-CM | POA: Diagnosis not present

## 2022-08-31 DIAGNOSIS — M961 Postlaminectomy syndrome, not elsewhere classified: Secondary | ICD-10-CM | POA: Diagnosis not present

## 2022-11-10 DIAGNOSIS — M961 Postlaminectomy syndrome, not elsewhere classified: Secondary | ICD-10-CM | POA: Diagnosis not present

## 2022-11-10 DIAGNOSIS — Z6831 Body mass index (BMI) 31.0-31.9, adult: Secondary | ICD-10-CM | POA: Diagnosis not present

## 2022-11-10 DIAGNOSIS — M5416 Radiculopathy, lumbar region: Secondary | ICD-10-CM | POA: Diagnosis not present

## 2022-11-10 DIAGNOSIS — F1120 Opioid dependence, uncomplicated: Secondary | ICD-10-CM | POA: Diagnosis not present

## 2022-11-20 DIAGNOSIS — Z0000 Encounter for general adult medical examination without abnormal findings: Secondary | ICD-10-CM | POA: Diagnosis not present

## 2022-11-20 DIAGNOSIS — E11319 Type 2 diabetes mellitus with unspecified diabetic retinopathy without macular edema: Secondary | ICD-10-CM | POA: Insufficient documentation

## 2022-11-20 DIAGNOSIS — I10 Essential (primary) hypertension: Secondary | ICD-10-CM | POA: Diagnosis not present

## 2022-11-20 DIAGNOSIS — Z794 Long term (current) use of insulin: Secondary | ICD-10-CM | POA: Diagnosis not present

## 2022-11-20 DIAGNOSIS — Z125 Encounter for screening for malignant neoplasm of prostate: Secondary | ICD-10-CM | POA: Diagnosis not present

## 2022-11-20 DIAGNOSIS — Z1159 Encounter for screening for other viral diseases: Secondary | ICD-10-CM | POA: Diagnosis not present

## 2022-11-20 DIAGNOSIS — Z5181 Encounter for therapeutic drug level monitoring: Secondary | ICD-10-CM | POA: Diagnosis not present

## 2022-12-11 DIAGNOSIS — E119 Type 2 diabetes mellitus without complications: Secondary | ICD-10-CM | POA: Diagnosis not present

## 2022-12-24 DIAGNOSIS — E1165 Type 2 diabetes mellitus with hyperglycemia: Secondary | ICD-10-CM | POA: Diagnosis not present

## 2022-12-24 DIAGNOSIS — E1142 Type 2 diabetes mellitus with diabetic polyneuropathy: Secondary | ICD-10-CM | POA: Diagnosis not present

## 2022-12-24 DIAGNOSIS — E11319 Type 2 diabetes mellitus with unspecified diabetic retinopathy without macular edema: Secondary | ICD-10-CM | POA: Diagnosis not present

## 2022-12-24 DIAGNOSIS — Z794 Long term (current) use of insulin: Secondary | ICD-10-CM | POA: Diagnosis not present

## 2023-01-05 ENCOUNTER — Encounter: Payer: Self-pay | Admitting: Podiatry

## 2023-01-05 ENCOUNTER — Ambulatory Visit: Payer: Medicare Other | Admitting: Podiatry

## 2023-01-05 DIAGNOSIS — M79674 Pain in right toe(s): Secondary | ICD-10-CM

## 2023-01-05 DIAGNOSIS — G609 Hereditary and idiopathic neuropathy, unspecified: Secondary | ICD-10-CM | POA: Diagnosis not present

## 2023-01-05 DIAGNOSIS — E11319 Type 2 diabetes mellitus with unspecified diabetic retinopathy without macular edema: Secondary | ICD-10-CM | POA: Diagnosis not present

## 2023-01-05 DIAGNOSIS — B351 Tinea unguium: Secondary | ICD-10-CM | POA: Diagnosis not present

## 2023-01-05 DIAGNOSIS — M79675 Pain in left toe(s): Secondary | ICD-10-CM | POA: Diagnosis not present

## 2023-01-05 DIAGNOSIS — Z794 Long term (current) use of insulin: Secondary | ICD-10-CM

## 2023-01-05 NOTE — Progress Notes (Signed)
  Subjective:  Patient ID: Jonathan Glenn, male    DOB: 1966/06/09,   MRN: [id number]  Chief Complaint  Patient presents with   Diabetes    Diabetic foot exam , Diabetic foot care     57 y.o. male presents for concern of thickened elongated and painful nails that are difficult to trim. Requesting to have them trimmed today. Relates burning and tingling in their feet. Patient is diabetic and last A1c was No results found for: "HGBA1C" .   PCP:  Carol Ada, MD    . Denies any other pedal complaints. Denies n/v/f/c.   History reviewed. No pertinent past medical history.  Objective:  Physical Exam: Vascular: DP/PT pulses 2/4 bilateral. CFT <3 seconds. Absent hair growth on digits. Edema noted to bilateral lower extremities. Xerosis noted bilaterally.  Skin. No lacerations or abrasions bilateral feet. Nails 1-5 bilateral  are discolored and elongated with subungual debris.  Musculoskeletal: MMT 5/5 bilateral lower extremities in DF, PF, Inversion and Eversion. Deceased ROM in DF of ankle joint.  Neurological: Sensation intact to light touch. Protective sensation diminished bilateral.    Assessment:   1. Pain due to onychomycosis of toenails of both feet   2. Idiopathic peripheral neuropathy   3. Type 2 diabetes mellitus with retinopathy, with long-term current use of insulin, macular edema presence unspecified, unspecified laterality, unspecified retinopathy severity ([place])      Plan:  Patient was evaluated and treated and all questions answered. -Discussed and educated patient on diabetic foot care, especially with  regards to the vascular, neurological and musculoskeletal systems.  -Stressed the importance of good glycemic control and the detriment of not  controlling glucose levels in relation to the foot. -Discussed supportive shoes at all times and checking feet regularly.  -Mechanically debrided all nails 1-5 bilateral using sterile nail nipper and filed with dremel  without incident  -Answered all patient questions -Patient to return  in 3 months for at risk foot care -Patient advised to call the office if any problems or questions arise in the meantime.   Lorenda Peck, DPM

## 2023-02-04 DIAGNOSIS — D124 Benign neoplasm of descending colon: Secondary | ICD-10-CM | POA: Diagnosis not present

## 2023-02-04 DIAGNOSIS — K635 Polyp of colon: Secondary | ICD-10-CM | POA: Diagnosis not present

## 2023-02-04 DIAGNOSIS — K5730 Diverticulosis of large intestine without perforation or abscess without bleeding: Secondary | ICD-10-CM | POA: Diagnosis not present

## 2023-02-04 DIAGNOSIS — Z1211 Encounter for screening for malignant neoplasm of colon: Secondary | ICD-10-CM | POA: Diagnosis not present

## 2023-02-04 DIAGNOSIS — K648 Other hemorrhoids: Secondary | ICD-10-CM | POA: Diagnosis not present

## 2023-02-09 DIAGNOSIS — M961 Postlaminectomy syndrome, not elsewhere classified: Secondary | ICD-10-CM | POA: Diagnosis not present

## 2023-02-09 DIAGNOSIS — F1120 Opioid dependence, uncomplicated: Secondary | ICD-10-CM | POA: Diagnosis not present

## 2023-02-09 DIAGNOSIS — M5416 Radiculopathy, lumbar region: Secondary | ICD-10-CM | POA: Diagnosis not present

## 2023-03-18 DIAGNOSIS — E1142 Type 2 diabetes mellitus with diabetic polyneuropathy: Secondary | ICD-10-CM | POA: Diagnosis not present

## 2023-03-18 DIAGNOSIS — E11319 Type 2 diabetes mellitus with unspecified diabetic retinopathy without macular edema: Secondary | ICD-10-CM | POA: Diagnosis not present

## 2023-03-18 DIAGNOSIS — E1165 Type 2 diabetes mellitus with hyperglycemia: Secondary | ICD-10-CM | POA: Diagnosis not present

## 2023-03-18 DIAGNOSIS — Z794 Long term (current) use of insulin: Secondary | ICD-10-CM | POA: Diagnosis not present

## 2023-04-12 ENCOUNTER — Ambulatory Visit: Payer: Medicare Other | Admitting: Podiatry

## 2023-04-12 DIAGNOSIS — M79675 Pain in left toe(s): Secondary | ICD-10-CM | POA: Diagnosis not present

## 2023-04-12 DIAGNOSIS — M79674 Pain in right toe(s): Secondary | ICD-10-CM

## 2023-04-12 DIAGNOSIS — M722 Plantar fascial fibromatosis: Secondary | ICD-10-CM | POA: Diagnosis not present

## 2023-04-12 DIAGNOSIS — B351 Tinea unguium: Secondary | ICD-10-CM | POA: Insufficient documentation

## 2023-04-12 NOTE — Progress Notes (Signed)
       Subjective:  Patient ID: Jonathan Glenn, male    DOB: 1966/04/08,  MRN: [id number]   Jonathan Glenn presents to clinic today for:  Chief Complaint  Patient presents with   Nail Problem     Routine foot care    Foot Pain    Bilateral heel pain on going for awhile  . Patient notes nails are thick, discolored, elongated and painful in shoegear when trying to ambulate.  Patient also notes pain in both heels.  States that there is pain with her steps out of bed in the morning.  Pain with prolonged standing and walking.  Denies injury or bruising.  States that the pain is often a 3/10, but when aggravated it can occasionally feel like 10/10.  States diabetes is well-controlled.  PCP is Merri Brunette, MD.  Not on File  Review of Systems: Negative except as noted in the HPI.  Objective:  There were no vitals filed for this visit.  Jonathan Glenn is a pleasant 57 y.o. male in NAD. AAO x 3.  Vascular Examination: Capillary refill time is 3-5 seconds to toes bilateral. Palpable pedal pulses b/l LE. Digital hair present b/l. No pedal edema b/l. Skin temperature gradient WNL b/l. No varicosities b/l. No cyanosis or clubbing noted b/l.   Dermatological Examination: Pedal skin with normal turgor, texture and tone b/l. No open wounds. No interdigital macerations b/l. Toenails x10 are 3mm thick, discolored, dystrophic with subungual debris. There is pain with compression of the nail plates.  They are elongated x10  Neurological Examination: Protective sensation intact bilateral LE.   Musculoskeletal Examination: Muscle strength 5/5 to all LE muscle groups b/l.  No palpation to plantar aspect of both heels.  No pain on palpation to posterior heel.  Pain on palpation of proximal portion of the medial and central plantar fascial bands.    Assessment/Plan: 1. Plantar fascial fibromatosis   2. Pain due to onychomycosis of toenails of both feet      PR FT ARCH SUPRT PREMOLD  LONGIT FOR HOME USE ONLY DME NIGHT SPLINT  The mycotic toenails were sharply debrided x10 with sterile nail nippers and a power debriding burr to decrease bulk/thickness and length.    Discussed the etiology of the plantar fasciitis with the patient.  Discussed conservative treatment options today.  Since the pain is 3/10 today, we will hold off on corticosteroid injections.  He was fitted for a night splint which is a static AFO to be worn during nonweightbearing/sleeping hours with a soft interface material.  Also recommended power step inserts for arch support during the day.  Reviewed stretching exercises with patient as well.  Follow-up in 4 weeks if the heel pain continues.  Return in about 3 months (around 07/13/2023) for [facility].   Clerance Lav, DPM, FACFAS [facility]     [address] [place], [place] [address]                Office [phone]  Fax [phone]

## 2023-05-26 DIAGNOSIS — M961 Postlaminectomy syndrome, not elsewhere classified: Secondary | ICD-10-CM | POA: Diagnosis not present

## 2023-05-26 DIAGNOSIS — M5416 Radiculopathy, lumbar region: Secondary | ICD-10-CM | POA: Diagnosis not present

## 2023-05-26 DIAGNOSIS — F1120 Opioid dependence, uncomplicated: Secondary | ICD-10-CM | POA: Diagnosis not present

## 2023-06-15 DIAGNOSIS — E1142 Type 2 diabetes mellitus with diabetic polyneuropathy: Secondary | ICD-10-CM | POA: Diagnosis not present

## 2023-06-21 DIAGNOSIS — E1142 Type 2 diabetes mellitus with diabetic polyneuropathy: Secondary | ICD-10-CM | POA: Diagnosis not present

## 2023-06-21 DIAGNOSIS — Z794 Long term (current) use of insulin: Secondary | ICD-10-CM | POA: Diagnosis not present

## 2023-06-21 DIAGNOSIS — E1165 Type 2 diabetes mellitus with hyperglycemia: Secondary | ICD-10-CM | POA: Diagnosis not present

## 2023-06-21 DIAGNOSIS — E11319 Type 2 diabetes mellitus with unspecified diabetic retinopathy without macular edema: Secondary | ICD-10-CM | POA: Diagnosis not present

## 2023-07-13 ENCOUNTER — Encounter (INDEPENDENT_AMBULATORY_CARE_PROVIDER_SITE_OTHER): Payer: Medicare Other | Admitting: Podiatry

## 2023-07-14 NOTE — Progress Notes (Signed)
Patient was a no-show for today's scheduled appointment

## 2025-01-08 ENCOUNTER — Ambulatory Visit: Admitting: Neurology
